# Patient Record
Sex: Male | Born: 1955 | Race: White | Hispanic: No | Marital: Single | Smoking: Former smoker
Health system: Southern US, Community
[De-identification: ages and names within clinical notes are randomized; demographics above are authoritative.]

## PROBLEM LIST (undated history)

## (undated) HISTORY — PX: APPENDECTOMY: SHX54

## (undated) HISTORY — PX: KNEE SURGERY: SHX244

---

## 2014-12-30 DIAGNOSIS — I10 Essential (primary) hypertension: Secondary | ICD-10-CM | POA: Insufficient documentation

## 2014-12-30 DIAGNOSIS — E785 Hyperlipidemia, unspecified: Secondary | ICD-10-CM | POA: Insufficient documentation

## 2018-08-21 ENCOUNTER — Encounter (HOSPITAL_COMMUNITY): Payer: Self-pay | Admitting: Emergency Medicine

## 2018-08-21 ENCOUNTER — Emergency Department (HOSPITAL_COMMUNITY)
Admission: EM | Admit: 2018-08-21 | Discharge: 2018-08-21 | Discharge status: Against Medical Advice | Payer: Medicaid Other | Attending: Emergency Medicine | Admitting: Emergency Medicine

## 2018-08-21 ENCOUNTER — Other Ambulatory Visit: Payer: Self-pay

## 2018-08-21 DIAGNOSIS — F419 Anxiety disorder, unspecified: Secondary | ICD-10-CM | POA: Insufficient documentation

## 2018-08-21 DIAGNOSIS — G4700 Insomnia, unspecified: Secondary | ICD-10-CM | POA: Diagnosis present

## 2018-08-21 DIAGNOSIS — Z87891 Personal history of nicotine dependence: Secondary | ICD-10-CM | POA: Insufficient documentation

## 2018-08-21 LAB — CBC WITH DIFFERENTIAL/PLATELET
Abs Immature Granulocytes: 0.03 10*3/uL (ref 0.00–0.07)
Basophils Absolute: 0.1 10*3/uL (ref 0.0–0.1)
Basophils Relative: 1 %
Eosinophils Absolute: 0.1 10*3/uL (ref 0.0–0.5)
Eosinophils Relative: 1 %
HCT: 45.4 % (ref 39.0–52.0)
Hemoglobin: 15.3 g/dL (ref 13.0–17.0)
Immature Granulocytes: 0 %
Lymphocytes Relative: 40 %
Lymphs Abs: 2.8 10*3/uL (ref 0.7–4.0)
MCH: 31.9 pg (ref 26.0–34.0)
MCHC: 33.7 g/dL (ref 30.0–36.0)
MCV: 94.8 fL (ref 80.0–100.0)
Monocytes Absolute: 0.7 10*3/uL (ref 0.1–1.0)
Monocytes Relative: 9 %
NRBC: 0 % (ref 0.0–0.2)
Neutro Abs: 3.4 10*3/uL (ref 1.7–7.7)
Neutrophils Relative %: 49 %
Platelets: 198 10*3/uL (ref 150–400)
RBC: 4.79 MIL/uL (ref 4.22–5.81)
RDW: 12.1 % (ref 11.5–15.5)
WBC: 7.1 10*3/uL (ref 4.0–10.5)

## 2018-08-21 LAB — URINALYSIS, ROUTINE W REFLEX MICROSCOPIC
Bilirubin Urine: NEGATIVE
Glucose, UA: NEGATIVE mg/dL
HGB URINE DIPSTICK: NEGATIVE
Ketones, ur: NEGATIVE mg/dL
Leukocytes, UA: NEGATIVE
Nitrite: NEGATIVE
Protein, ur: NEGATIVE mg/dL
Specific Gravity, Urine: 1.002 — ABNORMAL LOW (ref 1.005–1.030)
pH: 7 (ref 5.0–8.0)

## 2018-08-21 LAB — COMPREHENSIVE METABOLIC PANEL
ALBUMIN: 4.3 g/dL (ref 3.5–5.0)
ALT: 80 U/L — ABNORMAL HIGH (ref 0–44)
AST: 75 U/L — ABNORMAL HIGH (ref 15–41)
Alkaline Phosphatase: 70 U/L (ref 38–126)
Anion gap: 12 (ref 5–15)
BUN: 11 mg/dL (ref 8–23)
CHLORIDE: 100 mmol/L (ref 98–111)
CO2: 21 mmol/L — ABNORMAL LOW (ref 22–32)
Calcium: 8.6 mg/dL — ABNORMAL LOW (ref 8.9–10.3)
Creatinine, Ser: 0.86 mg/dL (ref 0.61–1.24)
GFR calc Af Amer: >60 mL/min (ref >60)
Glucose, Bld: 123 mg/dL — ABNORMAL HIGH (ref 70–99)
Potassium: 3.5 mmol/L (ref 3.5–5.1)
Sodium: 133 mmol/L — ABNORMAL LOW (ref 135–145)
Total Bilirubin: 0.8 mg/dL (ref 0.3–1.2)
Total Protein: 7.7 g/dL (ref 6.5–8.1)

## 2018-08-21 LAB — RAPID URINE DRUG SCREEN, HOSP PERFORMED
AMPHETAMINES: NOT DETECTED
Barbiturates: NOT DETECTED
Benzodiazepines: NOT DETECTED
Cocaine: NOT DETECTED
Opiates: NOT DETECTED
Tetrahydrocannabinol: NOT DETECTED

## 2018-08-21 LAB — ETHANOL: ALCOHOL ETHYL (B): 199 mg/dL — AB (ref <10)

## 2018-08-21 NOTE — ED Provider Notes (Signed)
[facility] EMERGENCY DEPARTMENT Provider Note   CSN: [id number] Arrival date & time: 08/21/18  1517     History   Chief Complaint Chief Complaint  Patient presents with  . V70.1    HPI Vincent Hart is a 63 y.o. male.  HPI   The patient presents for evaluation of existence, difficulty sleeping, poor appetite, anger, periods of crying, and needing to use alcohol to help calm himself down.  He lives alone.  He came here by private vehicle.  He is somewhat upset over the divorce he went through 5 years ago.  4 weeks ago he stopped smoking to get more healthy.  He feels that this caused him to become irritable, anxious, and have difficulty sleeping.  He is also been bloated recently and describes having occasional hard bowel movements and sometimes soft bowel movements.  He denies fever, chills, chest pain, focal weakness or paresthesia.  Does not have ongoing symptoms of depression although sometimes he cries.  He denies suicidal ideation, currently or suicidal plan.  No prior psychiatric history.  He states he used to use alcohol heavily, but now drinks rarely.  There are no other known modifying factors.  History reviewed. No pertinent past medical history.  There are no active problems to display for this patient.   Past Surgical History:  Procedure Laterality Date  . APPENDECTOMY    . KNEE SURGERY Left         Home Medications    Prior to Admission medications   Not on File    Family History No family history on file.  Social History Social History   Tobacco Use  . Smoking status: Former Smoker    Last attempt to quit: 07/24/2018    Years since quitting: 0.0  . Smokeless tobacco: Never Used  Substance Use Topics  . Alcohol use: Yes    Alcohol/week: 2.0 standard drinks    Types: 2 Glasses of wine per week    Comment: 1-2 glasses of wine per day   . Drug use: Not on file     Allergies   Patient has no known allergies.   Review of Systems Review of  Systems  All other systems reviewed and are negative.    Physical Exam Updated Vital Signs BP (!) 140/109   Pulse (!) 103   Temp 98 F (36.7 C) (Oral)   Resp 16   Ht 5\' 10"  (1.778 m)   Wt 100.2 kg   SpO2 (!) 88% Comment: edp aware, pt aware his o2 sat is reading low, pt still requesting to leave.  BMI 31.71 kg/m   Physical Exam Vitals signs and nursing note reviewed.  Constitutional:      General: He is not in acute distress.    Appearance: Normal appearance. He is well-developed. He is obese. He is not ill-appearing, toxic-appearing or diaphoretic.  HENT:     Head: Normocephalic and atraumatic.     Right Ear: External ear normal.     Left Ear: External ear normal.  Eyes:     Conjunctiva/sclera: Conjunctivae normal.     Pupils: Pupils are equal, round, and reactive to light.  Neck:     Musculoskeletal: Normal range of motion and neck supple.     Trachea: Phonation normal.  Cardiovascular:     Rate and Rhythm: Normal rate and regular rhythm.     Heart sounds: Normal heart sounds.  Pulmonary:     Effort: Pulmonary effort is normal.     Breath  sounds: Normal breath sounds.  Abdominal:     Palpations: Abdomen is soft.     Tenderness: There is no abdominal tenderness.  Musculoskeletal: Normal range of motion.  Skin:    General: Skin is warm and dry.  Neurological:     Mental Status: He is alert and oriented to person, place, and time.     Cranial Nerves: No cranial nerve deficit.     Sensory: No sensory deficit.     Motor: No abnormal muscle tone.     Coordination: Coordination normal.     Comments: No dysarthria, aphasia or nystagmus.  Psychiatric:        Behavior: Behavior normal.        Thought Content: Thought content normal.        Judgment: Judgment normal.     Comments: Alternately anxious and tearful.  He is cooperative.  He does not appear to be responding to internal stimuli.  Patient is lucid.      ED Treatments / Results  Labs (all labs ordered  are listed, but only abnormal results are displayed) Labs Reviewed  ETHANOL - Abnormal; Notable for the following components:      Result Value   Alcohol, Ethyl (B) 199 (*)    All other components within normal limits  URINALYSIS, ROUTINE W REFLEX MICROSCOPIC - Abnormal; Notable for the following components:   Color, Urine COLORLESS (*)    Specific Gravity, Urine 1.002 (*)    All other components within normal limits  CBC WITH DIFFERENTIAL/PLATELET  COMPREHENSIVE METABOLIC PANEL  RAPID URINE DRUG SCREEN, HOSP PERFORMED    EKG None  Radiology No results found.  Procedures Procedures (including critical care time)  Medications Ordered in ED Medications - No data to display   Initial Impression / Assessment and Plan / ED Course  I have reviewed the triage vital signs and the nursing notes.  Pertinent labs & imaging results that were available during my care of the patient were reviewed by me and considered in my medical decision making (see chart for details).      Patient Vitals for the past 24 hrs:  BP Temp Temp src Pulse Resp SpO2 Height Weight  08/21/18 1640 (!) 140/109 - - (!) 103 - (!) 88 % - -  08/21/18 1527 (!) 145/103 98 F (36.7 C) Oral 85 16 97 % - -  08/21/18 1525 - - - - - - 5\' 10"  (1.778 m) 100.2 kg    4:40 PM Reevaluation with update and discussion. After initial assessment and treatment, an updated evaluation reveals he is standing in the hall at the nurses desk, having his vital signs checked.  He is alert and cooperative and states he wants to go, now.  He is respectful and shook my hand.  He was encouraged to return for ongoing problems and to seek care as needed.  He is choosing to leave AGAINST MEDICAL ADVICE at this time. Mancel BaleElliott Milanni Ayub   Medical Decision Making: Nonspecific anxiety, possibly related to tobacco sensation versus suspected alcohol abuse.  Mild depressive symptoms, not severe, and no evidence for suicidal ideation or plan at this time.   There is no indication for medical hospitalization, involuntary commitment, or further treatment in the ED, since the patient is choosing to leave.  The patient appears competent to make the decision to leave.  CRITICAL CARE-no Performed by: Mancel BaleElliott Jmari Pelc  Nursing Notes Reviewed/ Care Coordinated Applicable Imaging Reviewed Interpretation of Laboratory Data incorporated into ED treatment  Disposition-patient left AGAINST MEDICAL ADVICE    Final Clinical Impressions(s) / ED Diagnoses   Final diagnoses:  Anxiety    ED Discharge Orders    None       Mancel Bale, MD 08/23/18 [phone]

## 2018-08-21 NOTE — ED Triage Notes (Addendum)
Bloated, light headed and cant sleep x 4weeks when he quit smoking.  Pt reports that he has also had suicidal thoughts and has been very emotional and just burst into tears at times. Pt became tearful at one time in triage.

## 2018-08-21 NOTE — ED Notes (Signed)
PT requesting to leave before test results are back. EDP made aware and spoke with pt and told him to return to ED if he felt any worse. PT signed out AMA and ambulated down hallway to waiting area.

## 2018-08-21 NOTE — ED Notes (Addendum)
PT c/o increased anxiety and restlessness at night since he quit smoking about 2-3 weeks ago. PT denies SI/HI at this time.

## 2018-08-21 NOTE — ED Notes (Addendum)
While asking the [place] suicide questionnaire the patient states absolutly not to the question about suicidal thoughts.  .  He says he thought everyone had suicidal thoughts.  States he does not have a plan and would never kill himself.

## 2018-08-23 ENCOUNTER — Encounter (HOSPITAL_COMMUNITY): Payer: Self-pay | Admitting: Emergency Medicine

## 2018-08-23 ENCOUNTER — Emergency Department (HOSPITAL_COMMUNITY)
Admission: EM | Admit: 2018-08-23 | Discharge: 2018-08-23 | Disposition: A | Discharge status: Home / Self Care | Payer: Medicaid Other | Attending: Emergency Medicine | Admitting: Emergency Medicine

## 2018-08-23 ENCOUNTER — Other Ambulatory Visit: Payer: Self-pay

## 2018-08-23 DIAGNOSIS — Z5321 Procedure and treatment not carried out due to patient leaving prior to being seen by health care provider: Secondary | ICD-10-CM | POA: Diagnosis not present

## 2018-08-23 DIAGNOSIS — R109 Unspecified abdominal pain: Secondary | ICD-10-CM | POA: Diagnosis not present

## 2018-08-23 NOTE — ED Triage Notes (Signed)
Pt called EMS to his home, states he was here 2 days ago, quit smoking and drinking over 2 weeks ago. Today he had 2 glasses of wine due to feeling anxious and no sleep.  He was able to sleep for a little while, after waking up he feels like he could come out of his skin, aching all over. Denies SI/HI

## 2018-08-23 NOTE — ED Triage Notes (Signed)
Continues to have nausea, dry heaves, and decreased appetite.

## 2018-08-25 ENCOUNTER — Other Ambulatory Visit: Payer: Self-pay

## 2018-08-25 ENCOUNTER — Emergency Department (HOSPITAL_COMMUNITY)
Admission: EM | Admit: 2018-08-25 | Discharge: 2018-08-25 | Disposition: A | Discharge status: Home / Self Care | Payer: Medicaid Other | Attending: Emergency Medicine | Admitting: Emergency Medicine

## 2018-08-25 ENCOUNTER — Emergency Department (HOSPITAL_COMMUNITY): Payer: Medicaid Other

## 2018-08-25 ENCOUNTER — Encounter (HOSPITAL_COMMUNITY): Payer: Self-pay

## 2018-08-25 DIAGNOSIS — F10129 Alcohol abuse with intoxication, unspecified: Secondary | ICD-10-CM | POA: Insufficient documentation

## 2018-08-25 DIAGNOSIS — R0789 Other chest pain: Secondary | ICD-10-CM

## 2018-08-25 DIAGNOSIS — R1013 Epigastric pain: Secondary | ICD-10-CM | POA: Insufficient documentation

## 2018-08-25 DIAGNOSIS — F17203 Nicotine dependence unspecified, with withdrawal: Secondary | ICD-10-CM

## 2018-08-25 DIAGNOSIS — F17213 Nicotine dependence, cigarettes, with withdrawal: Secondary | ICD-10-CM | POA: Insufficient documentation

## 2018-08-25 DIAGNOSIS — F10920 Alcohol use, unspecified with intoxication, uncomplicated: Secondary | ICD-10-CM

## 2018-08-25 DIAGNOSIS — R079 Chest pain, unspecified: Secondary | ICD-10-CM | POA: Diagnosis not present

## 2018-08-25 DIAGNOSIS — E860 Dehydration: Secondary | ICD-10-CM | POA: Diagnosis not present

## 2018-08-25 DIAGNOSIS — Z87891 Personal history of nicotine dependence: Secondary | ICD-10-CM | POA: Insufficient documentation

## 2018-08-25 LAB — URINALYSIS, ROUTINE W REFLEX MICROSCOPIC
Bilirubin Urine: NEGATIVE
Glucose, UA: NEGATIVE mg/dL
Hgb urine dipstick: NEGATIVE
Ketones, ur: 5 mg/dL — AB
Leukocytes, UA: NEGATIVE
Nitrite: NEGATIVE
PROTEIN: 30 mg/dL — AB
Specific Gravity, Urine: 1.023 (ref 1.005–1.030)
pH: 5 (ref 5.0–8.0)

## 2018-08-25 LAB — COMPREHENSIVE METABOLIC PANEL
ALT: 92 U/L — ABNORMAL HIGH (ref 0–44)
AST: 100 U/L — AB (ref 15–41)
Albumin: 4.7 g/dL (ref 3.5–5.0)
Alkaline Phosphatase: 91 U/L (ref 38–126)
Anion gap: 13 (ref 5–15)
BUN: 18 mg/dL (ref 8–23)
CO2: 21 mmol/L — ABNORMAL LOW (ref 22–32)
Calcium: 9.1 mg/dL (ref 8.9–10.3)
Chloride: 98 mmol/L (ref 98–111)
Creatinine, Ser: 0.91 mg/dL (ref 0.61–1.24)
GFR calc Af Amer: >60 mL/min (ref >60)
GFR calc non Af Amer: >60 mL/min (ref >60)
GLUCOSE: 115 mg/dL — AB (ref 70–99)
Potassium: 3.9 mmol/L (ref 3.5–5.1)
Sodium: 132 mmol/L — ABNORMAL LOW (ref 135–145)
Total Bilirubin: 0.9 mg/dL (ref 0.3–1.2)
Total Protein: 8.2 g/dL — ABNORMAL HIGH (ref 6.5–8.1)

## 2018-08-25 LAB — CBC WITH DIFFERENTIAL/PLATELET
Abs Immature Granulocytes: 0.03 10*3/uL (ref 0.00–0.07)
Basophils Absolute: 0.1 10*3/uL (ref 0.0–0.1)
Basophils Relative: 0 %
Eosinophils Absolute: 0.1 10*3/uL (ref 0.0–0.5)
Eosinophils Relative: 1 %
HCT: 49 % (ref 39.0–52.0)
HEMOGLOBIN: 16.4 g/dL (ref 13.0–17.0)
Immature Granulocytes: 0 %
Lymphocytes Relative: 30 %
Lymphs Abs: 3.4 10*3/uL (ref 0.7–4.0)
MCH: 31.3 pg (ref 26.0–34.0)
MCHC: 33.5 g/dL (ref 30.0–36.0)
MCV: 93.5 fL (ref 80.0–100.0)
Monocytes Absolute: 0.7 10*3/uL (ref 0.1–1.0)
Monocytes Relative: 6 %
NEUTROS PCT: 63 %
Neutro Abs: 7 10*3/uL (ref 1.7–7.7)
Platelets: 181 10*3/uL (ref 150–400)
RBC: 5.24 MIL/uL (ref 4.22–5.81)
RDW: 12 % (ref 11.5–15.5)
WBC: 11.2 10*3/uL — ABNORMAL HIGH (ref 4.0–10.5)
nRBC: 0 % (ref 0.0–0.2)

## 2018-08-25 LAB — TROPONIN I: Troponin I: <0.03 ng/mL (ref <0.03)

## 2018-08-25 LAB — ETHANOL: Alcohol, Ethyl (B): 184 mg/dL — ABNORMAL HIGH (ref <10)

## 2018-08-25 LAB — MAGNESIUM: Magnesium: 2.3 mg/dL (ref 1.7–2.4)

## 2018-08-25 LAB — LIPASE, BLOOD: Lipase: 59 U/L — ABNORMAL HIGH (ref 11–51)

## 2018-08-25 MED ORDER — LORAZEPAM 2 MG/ML IJ SOLN: 0.0000 mg | Freq: Two times a day (BID) | INTRAMUSCULAR | Status: DC

## 2018-08-25 MED ORDER — THIAMINE HCL 100 MG/ML IJ SOLN: 100.0000 mg | Freq: Every day | INTRAMUSCULAR | Status: DC

## 2018-08-25 MED ORDER — FAMOTIDINE IN NACL 20-0.9 MG/50ML-% IV SOLN
20.0000 mg | Freq: Once | INTRAVENOUS | Status: AC
  Administered 2018-08-25: 20 mg via INTRAVENOUS
  Filled 2018-08-25: qty 50

## 2018-08-25 MED ORDER — SODIUM CHLORIDE 0.9 % IV BOLUS
1000.0000 mL | Freq: Once | INTRAVENOUS | Status: AC
  Administered 2018-08-25: 1000 mL via INTRAVENOUS

## 2018-08-25 MED ORDER — CHLORDIAZEPOXIDE HCL 25 MG PO CAPS: ORAL_CAPSULE | ORAL | 0 refills | Status: AC

## 2018-08-25 MED ORDER — LORAZEPAM 2 MG/ML IJ SOLN: 0.0000 mg | Freq: Four times a day (QID) | INTRAMUSCULAR | Status: DC

## 2018-08-25 MED ORDER — VITAMIN B-1 100 MG PO TABS: 100.0000 mg | ORAL_TABLET | Freq: Every day | ORAL | Status: DC

## 2018-08-25 MED ORDER — LORAZEPAM 2 MG/ML IJ SOLN
1.0000 mg | Freq: Once | INTRAMUSCULAR | Status: AC
  Administered 2018-08-25: 1 mg via INTRAVENOUS
  Filled 2018-08-25: qty 1

## 2018-08-25 MED ORDER — LORAZEPAM 1 MG PO TABS: 0.0000 mg | ORAL_TABLET | Freq: Four times a day (QID) | ORAL | Status: DC

## 2018-08-25 MED ORDER — LORAZEPAM 1 MG PO TABS: 0.0000 mg | ORAL_TABLET | Freq: Two times a day (BID) | ORAL | Status: DC

## 2018-08-25 MED ORDER — IOPAMIDOL (ISOVUE-300) INJECTION 61%
100.0000 mL | Freq: Once | INTRAVENOUS | Status: AC | PRN
  Administered 2018-08-25: 100 mL via INTRAVENOUS

## 2018-08-25 MED ORDER — ONDANSETRON HCL 4 MG/2ML IJ SOLN
4.0000 mg | Freq: Once | INTRAMUSCULAR | Status: AC
  Administered 2018-08-25: 4 mg via INTRAVENOUS
  Filled 2018-08-25: qty 2

## 2018-08-25 NOTE — ED Triage Notes (Signed)
Pt c/o abd pain x 3 days, states he has been vomiting.

## 2018-08-25 NOTE — ED Provider Notes (Signed)
Patient accepted at change of shift, he does have a problem of chronic tobacco use but went cold [place] recently.  He then started drinking because he was feeling more anxious.  He does have a history of significant alcohol use in the past but does not consider himself an alcoholic.  He has a history of pancreatitis many years ago.  He complains of intermittent epigastric pain which last for just a couple of minutes and then goes away.  At this time he is symptom-free.  His labs showed an elevated alcohol consistent with his history as well as a slight elevation in lipase though this was not clinically significant.  CT scan reveals that he does have what appears to be calcific chronic pancreatic findings but given his lack of any tenderness on exam I suspect this is more stomach ulcer related.  I counseled the patient about medications, alcohol, he will be given Librium, he has a follow-up with the family doctor within 4 days.  He is agreeable to return should symptoms worsen.   Eber Hong, MD 08/25/18 1007

## 2018-08-25 NOTE — ED Provider Notes (Signed)
[facility] EMERGENCY DEPARTMENT Provider Note   CSN: [id number] Arrival date & time: 08/25/18  0159  Time seen 3:25 AM   History   Chief Complaint Chief Complaint  Patient presents with  . Abdominal Pain    HPI Vincent Hart is a 63 y.o. male.  HPI patient states about 3 weeks ago he decided to quit smoking and states he was smoking 4 packs a day and he decided to cut back on his drinking.  He states he was drinking 3 mixed drinks a day now he drinks 1 to 2 glasses of wine a day.  He makes a comment that he does not keep alcohol in his house, however he did have wine last night.  He states for the past 3 days he has been unable to sleep.  He has a burning in his whole anterior chest and he initially put his hand on his right lateral abdomen however later he put it on the epigastric area.  He states he cannot sleep because his mind is racing, he specifically states several times he has never used drugs.  He has had nausea and vomiting to the point he is having dry heaves.  He has loss of appetite and is not eating.  He complains of having shakes.  He denies diarrhea, feeling he has things crawling on them or pins-and-needles sensation, or seeing dots or movement on the walls.  He denies fever, chills, cough, sore throat.  He states he feels depressed but is not suicidal or homicidal.  He does complain of epigastric abdominal pain and states "I am burning all over".  He states his last ED visit his pulse ox was 88% and he was feeling short of breath then but not now.  Patient states he had not seen a doctor in 20 years.    History reviewed. No pertinent past medical history.  There are no active problems to display for this patient.   Past Surgical History:  Procedure Laterality Date  . APPENDECTOMY    . KNEE SURGERY Left         Home Medications    Per patient aspirin and melatonin  Prior to Admission medications   Not on File    Family History No family history on  file.  Social History Social History   Tobacco Use  . Smoking status: Former Smoker    Last attempt to quit: 07/24/2018    Years since quitting: 0.0  . Smokeless tobacco: Never Used  Substance Use Topics  . Alcohol use: Yes    Alcohol/week: 2.0 standard drinks    Types: 2 Glasses of wine per week    Comment: 1-2 glasses of wine per day   . Drug use: Never  Patient used to be in the military   Allergies   Patient has no known allergies.   Review of Systems Review of Systems  All other systems reviewed and are negative.    Physical Exam Updated Vital Signs BP (!) 147/109 (BP Location: Left Arm)   Pulse (!) 102   Temp 98.9 F (37.2 C) (Oral)   Resp 19   Ht 5\' 10"  (1.778 m)   Wt 97.5 kg   SpO2 94%   BMI 30.85 kg/m   Vital signs normal except diastolic hypertension, tachycardia   Physical Exam Vitals signs and nursing note reviewed.  Constitutional:      General: He is in acute distress.     Appearance: Normal appearance. He is well-developed. He is  not ill-appearing or toxic-appearing.     Comments: Patient has trouble sitting still, he is pacing around the room.  HENT:     Head: Normocephalic and atraumatic.     Right Ear: External ear normal.     Left Ear: External ear normal.     Nose: Nose normal. No mucosal edema or rhinorrhea.     Mouth/Throat:     Mouth: Mucous membranes are dry.     Dentition: No dental abscesses.     Pharynx: No uvula swelling.     Comments: No tremor of the tongue Eyes:     Conjunctiva/sclera: Conjunctivae normal.     Pupils: Pupils are equal, round, and reactive to light.  Neck:     Musculoskeletal: Full passive range of motion without pain, normal range of motion and neck supple.  Cardiovascular:     Rate and Rhythm: Normal rate and regular rhythm.     Heart sounds: Normal heart sounds. No murmur. No friction rub. No gallop.   Pulmonary:     Effort: Pulmonary effort is normal. No respiratory distress.     Breath sounds:  Normal breath sounds. No wheezing, rhonchi or rales.  Chest:     Chest wall: No tenderness or crepitus.  Abdominal:     General: Bowel sounds are normal. There is no distension.     Palpations: Abdomen is soft.     Tenderness: There is abdominal tenderness in the epigastric area. There is no guarding or rebound.  Musculoskeletal: Normal range of motion.        General: No tenderness.     Comments: Moves all extremities well.   Skin:    General: Skin is warm and dry.     Coloration: Skin is not pale.     Findings: No erythema or rash.  Neurological:     General: No focal deficit present.     Mental Status: He is alert and oriented to person, place, and time.     Cranial Nerves: No cranial nerve deficit.     Comments: No tremor or asterixis  Psychiatric:        Mood and Affect: Mood is anxious.        Speech: Speech is rapid and pressured.        Behavior: Behavior is agitated.      ED Treatments / Results  Labs (all labs ordered are listed, but only abnormal results are displayed) Results for orders placed or performed during the hospital encounter of 08/25/18  Comprehensive metabolic panel  Result Value Ref Range   Sodium 132 (L) 135 - 145 mmol/L   Potassium 3.9 3.5 - 5.1 mmol/L   Chloride 98 98 - 111 mmol/L   CO2 21 (L) 22 - 32 mmol/L   Glucose, Bld 115 (H) 70 - 99 mg/dL   BUN 18 8 - 23 mg/dL   Creatinine, Ser 1.610.91 0.61 - 1.24 mg/dL   Calcium 9.1 8.9 - 09.610.3 mg/dL   Total Protein 8.2 (H) 6.5 - 8.1 g/dL   Albumin 4.7 3.5 - 5.0 g/dL   AST 045100 (H) 15 - 41 U/L   ALT 92 (H) 0 - 44 U/L   Alkaline Phosphatase 91 38 - 126 U/L   Total Bilirubin 0.9 0.3 - 1.2 mg/dL   GFR calc non Af Amer >60 >60 mL/min   GFR calc Af Amer >60 >60 mL/min   Anion gap 13 5 - 15  Lipase, blood  Result Value Ref Range   Lipase  59 (H) 11 - 51 U/L  CBC with Differential  Result Value Ref Range   WBC 11.2 (H) 4.0 - 10.5 K/uL   RBC 5.24 4.22 - 5.81 MIL/uL   Hemoglobin 16.4 13.0 - 17.0 g/dL   HCT  16.1 09.6 - 04.5 %   MCV 93.5 80.0 - 100.0 fL   MCH 31.3 26.0 - 34.0 pg   MCHC 33.5 30.0 - 36.0 g/dL   RDW 40.9 81.1 - 91.4 %   Platelets 181 150 - 400 K/uL   nRBC 0.0 0.0 - 0.2 %   Neutrophils Relative % 63 %   Neutro Abs 7.0 1.7 - 7.7 K/uL   Lymphocytes Relative 30 %   Lymphs Abs 3.4 0.7 - 4.0 K/uL   Monocytes Relative 6 %   Monocytes Absolute 0.7 0.1 - 1.0 K/uL   Eosinophils Relative 1 %   Eosinophils Absolute 0.1 0.0 - 0.5 K/uL   Basophils Relative 0 %   Basophils Absolute 0.1 0.0 - 0.1 K/uL   Immature Granulocytes 0 %   Abs Immature Granulocytes 0.03 0.00 - 0.07 K/uL  Urinalysis, Routine w reflex microscopic  Result Value Ref Range   Color, Urine YELLOW YELLOW   APPearance HAZY (A) CLEAR   Specific Gravity, Urine 1.023 1.005 - 1.030   pH 5.0 5.0 - 8.0   Glucose, UA NEGATIVE NEGATIVE mg/dL   Hgb urine dipstick NEGATIVE NEGATIVE   Bilirubin Urine NEGATIVE NEGATIVE   Ketones, ur 5 (A) NEGATIVE mg/dL   Protein, ur 30 (A) NEGATIVE mg/dL   Nitrite NEGATIVE NEGATIVE   Leukocytes, UA NEGATIVE NEGATIVE   RBC / HPF 0-5 0 - 5 RBC/hpf   WBC, UA 0-5 0 - 5 WBC/hpf   Bacteria, UA FEW (A) NONE SEEN   Squamous Epithelial / LPF 0-5 0 - 5   Mucus PRESENT    Sperm, UA PRESENT   Magnesium  Result Value Ref Range   Magnesium 2.3 1.7 - 2.4 mg/dL  Ethanol  Result Value Ref Range   Alcohol, Ethyl (B) 184 (H) <10 mg/dL   Laboratory interpretation all normal except leukocytosis, intoxication with alcohol, minor elevation of LFTs consistent with alcohol abuse    EKG None  Radiology No results found.  Procedures Procedures (including critical care time)  Medications Ordered in ED Medications  LORazepam (ATIVAN) injection 0-4 mg (0 mg Intravenous Not Given 08/25/18 0703)    Or  LORazepam (ATIVAN) tablet 0-4 mg ( Oral See Alternative 08/25/18 0703)  LORazepam (ATIVAN) injection 0-4 mg (has no administration in time range)    Or  LORazepam (ATIVAN) tablet 0-4 mg (has no  administration in time range)  thiamine (VITAMIN B-1) tablet 100 mg (has no administration in time range)    Or  thiamine (B-1) injection 100 mg (has no administration in time range)  sodium chloride 0.9 % bolus 1,000 mL (1,000 mLs Intravenous New Bag/Given 08/25/18 0641)  famotidine (PEPCID) IVPB 20 mg premix (has no administration in time range)  sodium chloride 0.9 % bolus 1,000 mL (0 mLs Intravenous Stopped 08/25/18 0641)  sodium chloride 0.9 % bolus 1,000 mL (0 mLs Intravenous Stopped 08/25/18 0641)  ondansetron (ZOFRAN) injection 4 mg (4 mg Intravenous Given 08/25/18 0403)  LORazepam (ATIVAN) injection 1 mg (1 mg Intravenous Given 08/25/18 0403)     Initial Impression / Assessment and Plan / ED Course  I have reviewed the triage vital signs and the nursing notes.  Pertinent labs & imaging results that were available during my care  of the patient were reviewed by me and considered in my medical decision making (see chart for details).     Patient appeared dehydrated, he was given IV fluids, IV Zofran, and IV Ativan.  His first CIWA score was 7  Recheck at 6:30 AM patient has put out about 200 cc of urine.  He has finished his 2 L bolus.  He states he is feeling shaky.  He was given a 3rd liter  Recheck at 7:30, patient has had his number 3 L of IV fluids.  He states "I am shaking".  He holds his hand up and violently shakes it.  However when I touch his other hand there is no tremor and then it stops shaking.  He is now complaining again of this chest pain and abdominal pain.  He states the chest pain has been there for at least 3 weeks.  He was given IV Pepcid.  EKG and troponin was added to his testing and CT abdomen was added.  He denies any prior history of DTs or withdrawal seizures.  Pt left at change of shift with Dr Hyacinth Meeker to get results of his CT scan and disposition.  Final Clinical Impressions(s) / ED Diagnoses   Final diagnoses:  Nicotine withdrawal  Atypical chest pain   Alcoholic intoxication without complication (HCC)  Epigastric abdominal pain    Disposition pending   Devoria Albe, MD, Concha Pyo, MD, Concha Pyo, MD 08/25/18 [phone]

## 2018-08-25 NOTE — ED Notes (Signed)
Pt states he still is not feeling well and has continued abdominal pain, MD & RN made aware.

## 2018-08-25 NOTE — Discharge Instructions (Signed)
These take the Librium exactly as prescribed, do not drink anymore alcohol, please follow-up with your doctor at your appointment on 5 February.  You should also start taking Pepcid twice a day, avoid any alcohol or anti-inflammatory medication such as ibuprofen or Aleve or aspirin as they will make the pain worse  Drink plenty of clear liquids for the next 48 hours  Emergency department for severe or worsening symptoms

## 2018-08-29 DIAGNOSIS — Z8719 Personal history of other diseases of the digestive system: Secondary | ICD-10-CM | POA: Insufficient documentation

## 2018-08-29 DIAGNOSIS — F411 Generalized anxiety disorder: Secondary | ICD-10-CM | POA: Insufficient documentation

## 2018-08-29 DIAGNOSIS — K760 Fatty (change of) liver, not elsewhere classified: Secondary | ICD-10-CM | POA: Insufficient documentation

## 2019-11-04 IMAGING — CT CT ABD-PELV W/ CM
2 of 5 series · 15 of 46 positions shown, 17 images · IV contrast (Isovue)
Comparison: None.

CLINICAL DATA: Abdominal pain for the past 3 days.  Vomiting.

EXAM:
CT ABDOMEN AND PELVIS WITH CONTRAST
TECHNIQUE: Multidetector CT imaging of the abdomen and pelvis was performed
using the standard protocol following bolus administration of
intravenous contrast.
CONTRAST:  100mL [id number] IOPAMIDOL ([id number]) INJECTION 61%

[Series 2: axial st · axial · 0.81mm/px · z∈[-516,-66]mm · 12 of 103 slices shown, 14 images]
[im 7/103  soft-tissue]
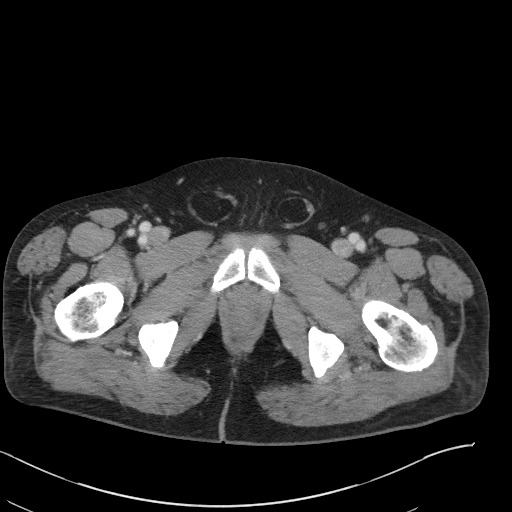
[im 7/103  bone]
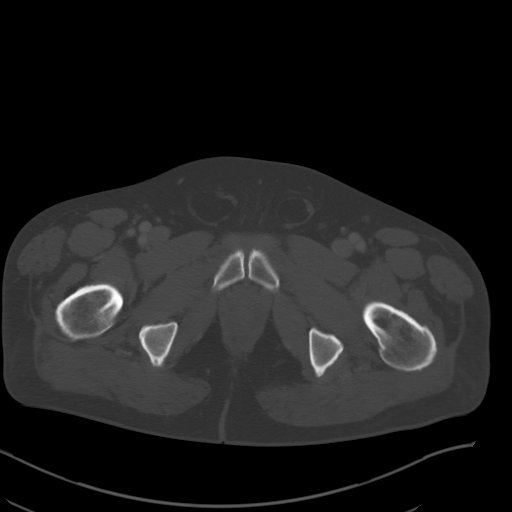
[im 19/103  soft-tissue]
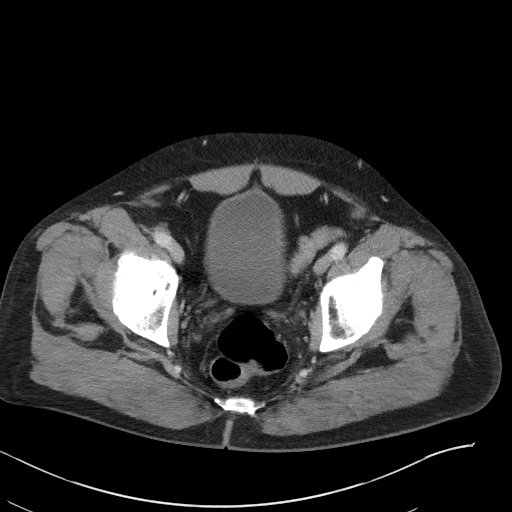
[im 25/103  soft-tissue]
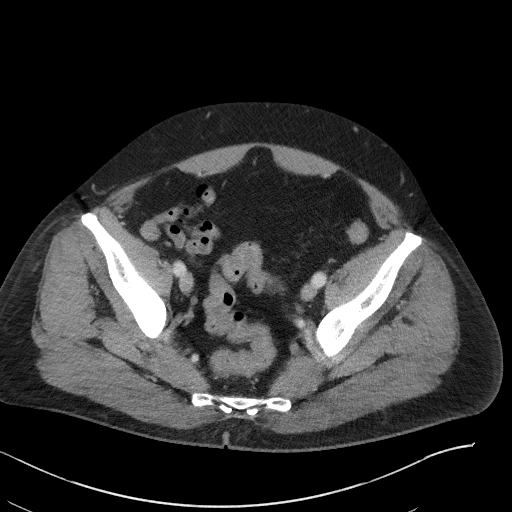
[im 31/103  soft-tissue]
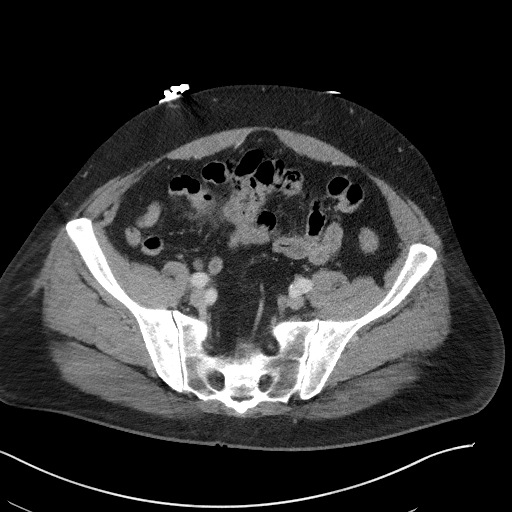
[im 43/103  soft-tissue]
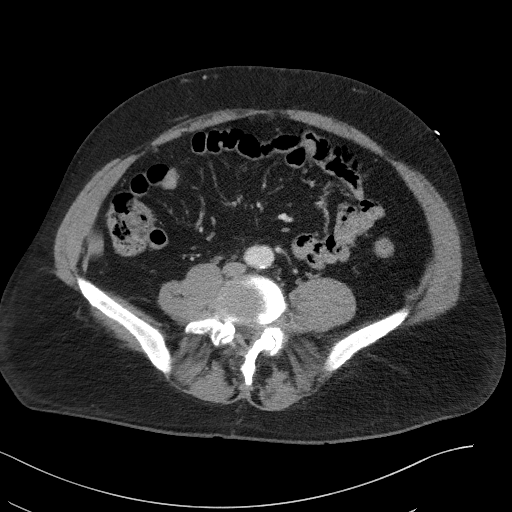
[im 49/103  soft-tissue]
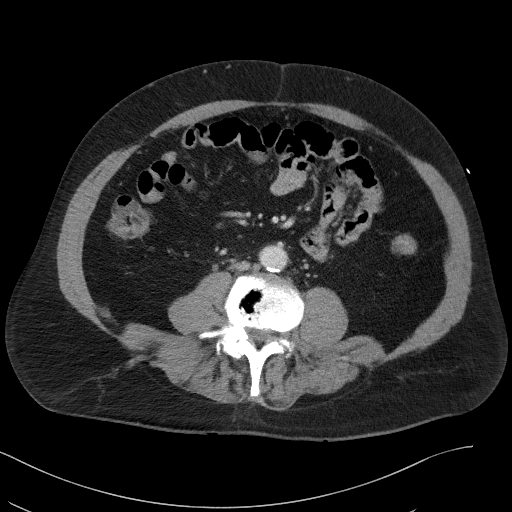
[im 55/103  soft-tissue]
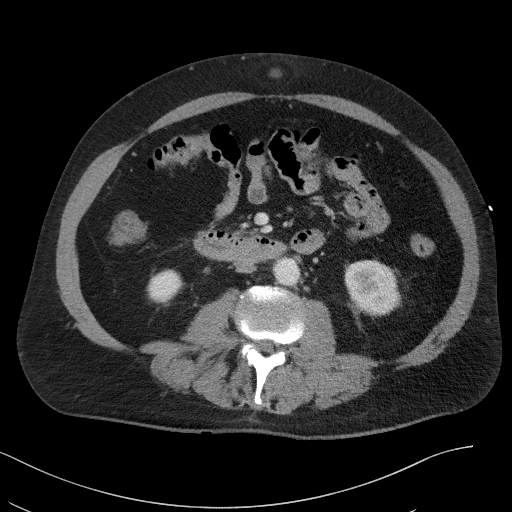
[im 67/103  soft-tissue]
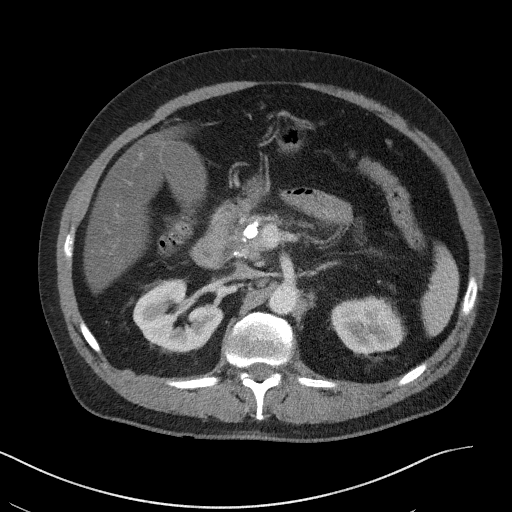
[im 73/103  soft-tissue]
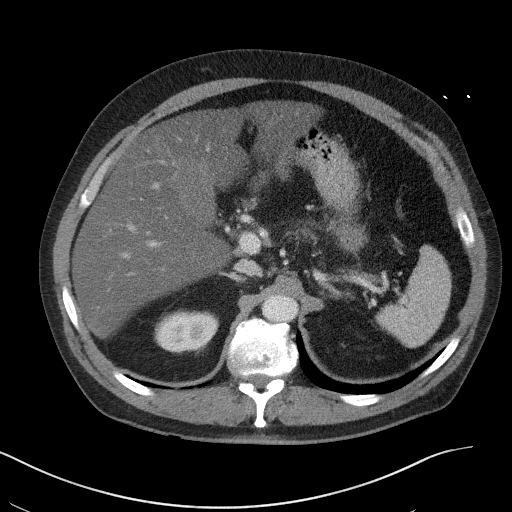
[im 73/103  bone]
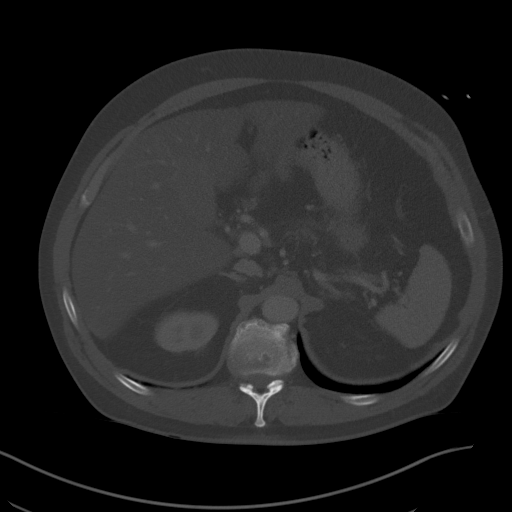
[im 79/103  soft-tissue]
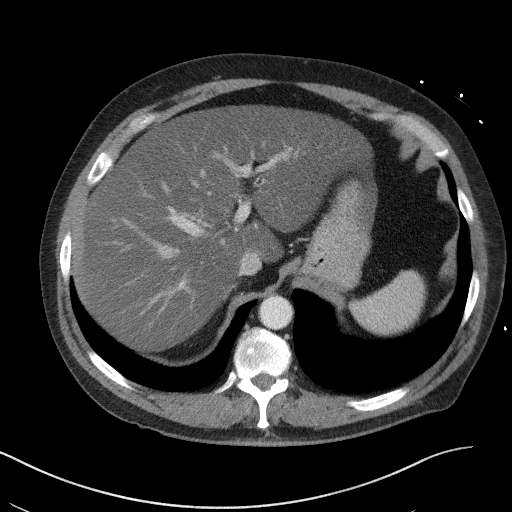
[im 91/103  soft-tissue]
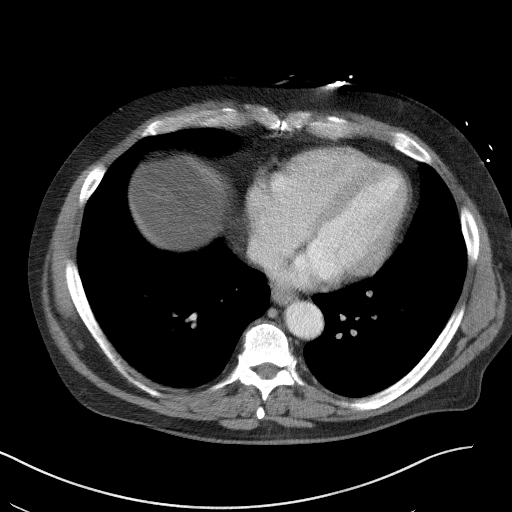
[im 97/103  soft-tissue]
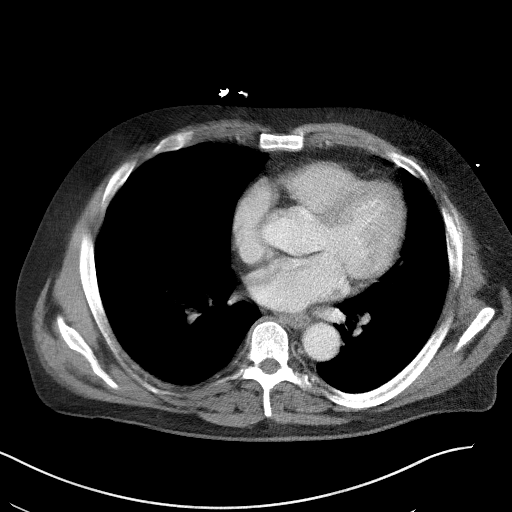

[Series 6: coronal st · coronal · 0.89mm/px · 3 of 117 slices shown]
[im 39/117  soft-tissue]
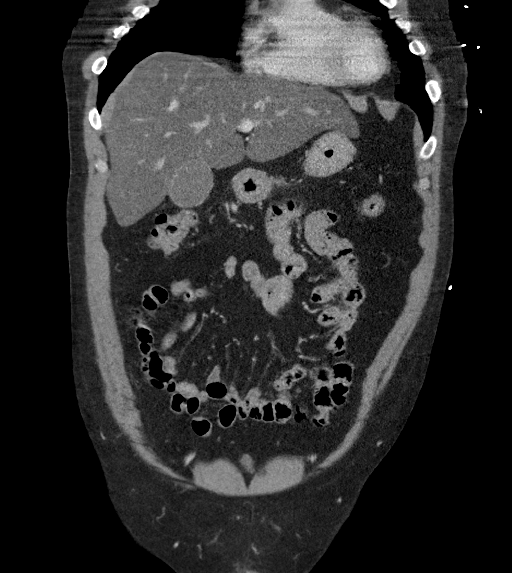
[im 52/117  soft-tissue]
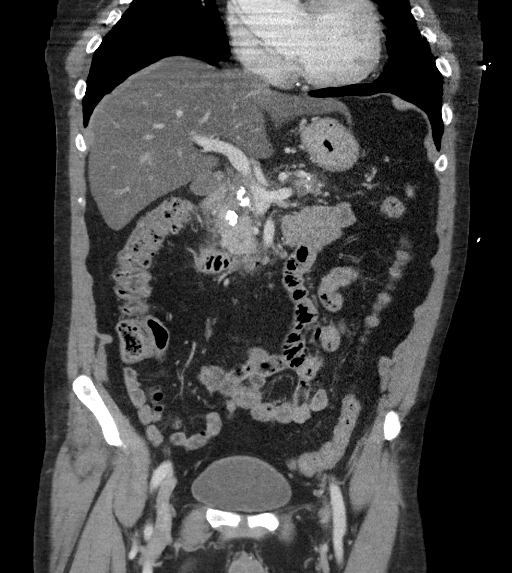
[im 65/117  soft-tissue]
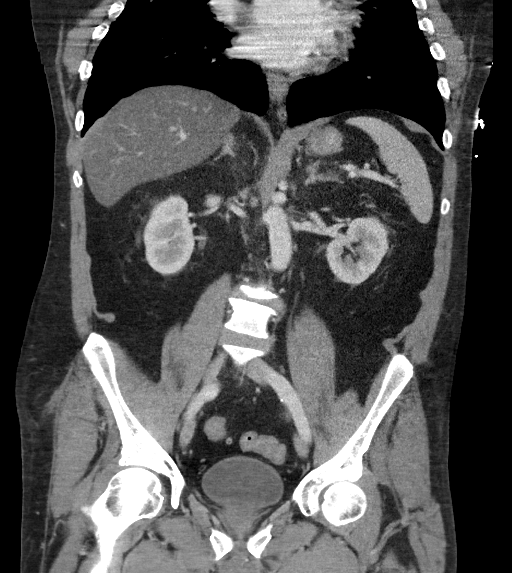

[15 of 46 positions shown; findings below may reference images not displayed]

FINDINGS: Lower chest: Limited visualization of the lower thorax demonstrates
minimal dependent subpleural ground-glass atelectasis. No discrete
focal airspace opacities. No pleural effusion.

Borderline cardiomegaly.  No pericardial effusion.

Hepatobiliary: There is diffuse decreased attenuation of the hepatic
parenchyma on this postcontrast examination suggestive of hepatic
steatosis. No discrete hepatic lesions. Normal appearance of the
gallbladder given degree distention. No radiopaque gallstones. No
intra or extrahepatic biliary dilatation. No ascites.

Pancreas: The pancreas is atrophic with multiple calcifications,
likely the sequela of chronic calcific pancreatitis. A minimal
amount of peripancreatic stranding is not excluded on the basis this
examination.

Spleen: Normal appearance of the spleen.

Adrenals/Urinary Tract: There is symmetric enhancement and excretion
of the bilateral kidneys. No definite renal stones on this
postcontrast examination. There is a minimal amount of symmetric
bilateral perinephric stranding, likely age and body habitus
related. No urinary obstruction.

Normal appearance the bilateral adrenal glands.

Normal appearance of the urinary bladder given degree distention.

Stomach/Bowel: Scattered colonic diverticulosis without evidence of
acute diverticulitis. Normal appearance of the terminal ileum. The
appendix is not visualized compatible with provided operative
history. No pneumoperitoneum, pneumatosis or portal venous gas.

Vascular/Lymphatic: Atherosclerotic plaque within a mildly tortuous
but normal caliber abdominal aorta. The major branch vessels of the
abdominal aorta appear patent on this non CTA examination.

No bulky retroperitoneal, mesenteric, pelvic or inguinal
lymphadenopathy.

Reproductive: Dystrophic calcifications within normal sized prostate
gland. Punctate phleboliths are seen with the lower pelvis
bilaterally. No free fluid in the pelvic cul-de-sac.

Other: Small bilateral mesenteric fat containing inguinal hernias.
Note is also made of a approximately 3.9 x 1.9 x 2.8 cm mesenteric
fat containing hernia along the mid ventral abdominal wall with
narrow neck measuring approximately 0.5 cm (image 76, series 7).

Musculoskeletal: No acute or aggressive osseous abnormalities.

Note is made of a right-sided L3 pars defect without associated
anterolisthesis. Moderate to severe DDD of L3-L4 with disc space
height loss, endplate irregularity and sclerosis. Mild scoliotic
curvature of the thoracolumbar spine with dominant component convex
to the left.
IMPRESSION: 1. Pancreatic atrophy with extensive pancreatic calcifications and
potential minimal amount of peripancreatic stranding as could be
seen in the setting of acute on chronic calcific pancreatitis.
Correlation with amylase and lipase levels could be performed as
indicated.
2. Diffuse decreased attenuation of the hepatic parenchyma as could
be seen in advanced hepatic steatosis. Correlation with LFTs could
be performed as indicated.
3. Approximately 3.9 cm narrow neck mesenteric fat containing
midline ventral abdominal wall and bilateral mesenteric fat
containing inguinal hernias.
4. Colonic diverticulosis without evidence of superimposed acute
diverticulitis.
5. Aortic Atherosclerosis ([id number]).

## 2021-06-04 DIAGNOSIS — M419 Scoliosis, unspecified: Secondary | ICD-10-CM | POA: Insufficient documentation

## 2021-06-04 DIAGNOSIS — M159 Polyosteoarthritis, unspecified: Secondary | ICD-10-CM | POA: Insufficient documentation

## 2021-06-04 DIAGNOSIS — M5135 Other intervertebral disc degeneration, thoracolumbar region: Secondary | ICD-10-CM | POA: Insufficient documentation

## 2021-11-04 DIAGNOSIS — R7303 Prediabetes: Secondary | ICD-10-CM | POA: Insufficient documentation

## 2021-11-05 DIAGNOSIS — K8689 Other specified diseases of pancreas: Secondary | ICD-10-CM | POA: Insufficient documentation

## 2021-12-30 ENCOUNTER — Encounter: Payer: Self-pay | Admitting: Physician Assistant

## 2021-12-30 ENCOUNTER — Ambulatory Visit: Payer: Medicare Other | Admitting: Physician Assistant

## 2021-12-30 DIAGNOSIS — J069 Acute upper respiratory infection, unspecified: Secondary | ICD-10-CM | POA: Diagnosis not present

## 2021-12-30 DIAGNOSIS — F411 Generalized anxiety disorder: Secondary | ICD-10-CM

## 2021-12-30 MED ORDER — CETIRIZINE HCL 10 MG PO TABS: 10.0000 mg | ORAL_TABLET | Freq: Every day | ORAL | 11 refills | Status: AC

## 2021-12-30 MED ORDER — AZITHROMYCIN 250 MG PO TABS: ORAL_TABLET | ORAL | 0 refills | Status: AC

## 2021-12-30 MED ORDER — BENZONATATE 200 MG PO CAPS: 200.0000 mg | ORAL_CAPSULE | Freq: Two times a day (BID) | ORAL | 0 refills | Status: AC | PRN

## 2021-12-30 MED ORDER — HYDROXYZINE HCL 25 MG PO TABS: 25.0000 mg | ORAL_TABLET | Freq: Three times a day (TID) | ORAL | 0 refills | Status: AC

## 2021-12-30 NOTE — Patient Instructions (Signed)
You are going to take azithromycin as directed.  You will also start taking Zyrtec on a daily basis, and I sent Tessalon Perles to help with your cough.  I did send a refill of your hydroxyzine to help you with your anxiety.  Make sure that you are staying very well-hydrated and get plenty of rest.  Please let us know if there is anything else we can do for you.  Kennieth Rad, PA-C Physician Assistant [facility] [email or web address]   Upper Respiratory Infection, Adult An upper respiratory infection (URI) is a common viral infection of the nose, throat, and upper air passages that lead to the lungs. The most common type of URI is the common cold. URIs usually get better on their own, without medical treatment. What are the causes? A URI is caused by a virus. You may catch a virus by: Breathing in droplets from an infected person's cough or sneeze. Touching something that has been exposed to the virus (is contaminated) and then touching your mouth, nose, or eyes. What increases the risk? You are more likely to get a URI if: You are very young or very old. You have close contact with others, such as at work, school, or a health care facility. You smoke. You have long-term (chronic) heart or lung disease. You have a weakened disease-fighting system (immune system). You have nasal allergies or asthma. You are experiencing a lot of stress. You have poor nutrition. What are the signs or symptoms? A URI usually involves some of the following symptoms: Runny or stuffy (congested) nose. Cough. Sneezing. Sore throat. Headache. Fatigue. Fever. Loss of appetite. Pain in your forehead, behind your eyes, and over your cheekbones (sinus pain). Muscle aches. Redness or irritation of the eyes. Pressure in the ears or face. How is this diagnosed? This condition may be diagnosed based on your medical history and symptoms, and a  physical exam. Your health care provider may use a swab to take a mucus sample from your nose (nasal swab). This sample can be tested to determine what virus is causing the illness. How is this treated? URIs usually get better on their own within 7-10 days. Medicines cannot cure URIs, but your health care provider may recommend certain medicines to help relieve symptoms, such as: Over-the-counter cold medicines. Cough suppressants. Coughing is a type of defense against infection that helps to clear the respiratory system, so take these medicines only as recommended by your health care provider. Fever-reducing medicines. Follow these instructions at home: Activity Rest as needed. If you have a fever, stay home from work or school until your fever is gone or until your health care provider says your URI cannot spread to other people (is no longer contagious). Your health care provider may have you wear a face mask to prevent your infection from spreading. Relieving symptoms Gargle with a mixture of salt and water 3-4 times a day or as needed. To make salt water, completely dissolve -1 tsp (3-6 g) of salt in 1 cup (237 mL) of warm water. Use a cool-mist humidifier to add moisture to the air. This can help you breathe more easily. Eating and drinking  Drink enough fluid to keep your urine pale yellow. Eat soups and other clear broths. General instructions  Take over-the-counter and prescription medicines only as told by your health care provider. These include cold medicines, fever reducers, and cough suppressants. Do not use any products that contain nicotine or tobacco. These products include cigarettes, chewing  tobacco, and vaping devices, such as e-cigarettes. If you need help quitting, ask your health care provider. Stay away from secondhand smoke. Stay up to date on all immunizations, including the yearly (annual) flu vaccine. Keep all follow-up visits. This is important. How to prevent  the spread of infection to others URIs can be contagious. To prevent the infection from spreading: Wash your hands with soap and water for at least 20 seconds. If soap and water are not available, use hand sanitizer. Avoid touching your mouth, face, eyes, or nose. Cough or sneeze into a tissue or your sleeve or elbow instead of into your hand or into the air.  Contact a health care provider if: You are getting worse instead of better. You have a fever or chills. Your mucus is brown or red. You have yellow or brown discharge coming from your nose. You have pain in your face, especially when you bend forward. You have swollen neck glands. You have pain while swallowing. You have white areas in the back of your throat. Get help right away if: You have shortness of breath that gets worse. You have severe or persistent: Headache. Ear pain. Sinus pain. Chest pain. You have chronic lung disease along with any of the following: Making high-pitched whistling sounds when you breathe, most often when you breathe out (wheezing). Prolonged cough (more than 14 days). Coughing up blood. A change in your usual mucus. You have a stiff neck. You have changes in your: Vision. Hearing. Thinking. Mood. These symptoms may be an emergency. Get help right away. Call 911. Do not wait to see if the symptoms will go away. Do not drive yourself to the hospital. Summary An upper respiratory infection (URI) is a common infection of the nose, throat, and upper air passages that lead to the lungs. A URI is caused by a virus. URIs usually get better on their own within 7-10 days. Medicines cannot cure URIs, but your health care provider may recommend certain medicines to help relieve symptoms. This information is not intended to replace advice given to you by your health care provider. Make sure you discuss any questions you have with your health care provider. Document Revised: 02/10/2021 Document  Reviewed: 02/10/2021 Elsevier Patient Education  [place].

## 2021-12-30 NOTE — Progress Notes (Signed)
New Patient Office Visit  Subjective    Patient ID: Vincent Hart, male    DOB: [birth year]  Age: 66 y.o. MRN: [id number]  CC:  Chief Complaint  Patient presents with   URI    Congestion, sore throat, cramps, maybe fever last night.   Virtual Visit via Telephone Note  I connected with Marlin Canary on 12/30/21 at 12:00 PM EDT by telephone and verified that I am speaking with the correct person using two identifiers.  Location: Patient: parking lot  Provider: [facility]    I discussed the limitations, risks, security and privacy concerns of performing an evaluation and management service by telephone and the availability of in person appointments. I also discussed with the patient that there may be a patient responsible charge related to this service. The patient expressed understanding and agreed to proceed.   History of Present Illness: States that he started feeling poorly two days ago, states that he has been having a body ache, runny nose, body aches, sore throat, right ear, cough with greenish white sputum.  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  States that he has not tried anything for relief.  States that he is eating and drinking okay. Volunteers in shelter in kitchen Has not tried anything for relief  States that he is feeling very edgy, has previously been prescribed hydroxyzine with relief, but has been ut of this medication.  Requests refill.  No abx in the last 30 days  Goes to [facility] in [employer]  Observations/Objective: Medical history and current medications reviewed, no physical exam completed  Outpatient Encounter Medications as of 12/30/2021  Medication Sig   azithromycin (ZITHROMAX) 250 MG tablet Take 2 tabs PO day 1, then take 1 tab PO once daily   benzonatate (TESSALON) 200 MG capsule Take 1 capsule (200 mg total) by mouth 2  (two) times daily as needed for cough.   cetirizine (ZYRTEC ALLERGY) 10 MG tablet Take 1 tablet (10 mg total) by mouth daily.   escitalopram (LEXAPRO) 20 MG tablet Take 20 mg by mouth daily.   metFORMIN (GLUCOPHAGE) 500 MG tablet Take 500 mg by mouth 2 (two) times daily.   predniSONE (DELTASONE) 10 MG tablet Take by mouth.   traZODone (DESYREL) 150 MG tablet Take 150 mg by mouth at bedtime.   chlordiazePOXIDE (LIBRIUM) 25 MG capsule 50mg  PO TID x 1D, then 25-50mg  PO BID X 1D, then 25-50mg  PO QD X 1D (Patient not taking: Reported on 12/30/2021)   CREON 36000-114000 units CPEP capsule Take by mouth. (Patient not taking: Reported on 12/30/2021)   hydrOXYzine (ATARAX) 25 MG tablet Take 1 tablet (25 mg total) by mouth 3 (three) times daily.   meloxicam (MOBIC) 7.5 MG tablet Take 15 mg by mouth daily. (Patient not taking: Reported on 12/30/2021)   methocarbamol (ROBAXIN) 500 MG tablet Take 500 mg by mouth 2 (two) times daily. (Patient not taking: Reported on 12/30/2021)   naproxen (NAPROSYN) 500 MG tablet Take 500 mg by mouth 2 (two) times daily. (Patient not taking: Reported on 12/30/2021)   pantoprazole (PROTONIX) 40 MG tablet Take 40 mg by mouth daily. (Patient not taking: Reported on 12/30/2021)   [DISCONTINUED] hydrOXYzine (ATARAX) 25 MG tablet Take 25 mg by mouth 3 (three) times daily. (Patient not taking: Reported on 12/30/2021)   No facility-administered encounter medications on file as of 12/30/2021.  History reviewed. No pertinent past medical history.  Past Surgical History:  Procedure Laterality Date   APPENDECTOMY     KNEE SURGERY Left     History reviewed. No pertinent family history.  Social History   Socioeconomic History   Marital status: Single    Spouse name: Not on file   Number of children: Not on file   Years of education: Not on file   Highest education level: Not on file  Occupational History   Not on file  Tobacco Use   Smoking status: Former    Types: Cigarettes    Quit  date: 07/24/2018    Years since quitting: 3.4   Smokeless tobacco: Never  Substance and Sexual Activity   Alcohol use: Yes    Alcohol/week: 2.0 standard drinks of alcohol    Types: 2 Glasses of wine per week    Comment: 1-2 glasses of wine per day    Drug use: Never   Sexual activity: Not on file  Other Topics Concern   Not on file  Social History Narrative   Not on file   Social Determinants of Health   Financial Resource Strain: Not on file  Food Insecurity: Not on file  Transportation Needs: Not on file  Physical Activity: Not on file  Stress: Not on file  Social Connections: Not on file  Intimate Partner Violence: Not on file    Review of Systems  Constitutional:  Negative for chills and fever.  HENT:  Positive for congestion, ear pain and sore throat.   Eyes: Negative.   Respiratory:  Positive for cough and sputum production. Negative for shortness of breath and wheezing.   Cardiovascular:  Negative for chest pain.  Gastrointestinal:  Negative for nausea and vomiting.  Genitourinary: Negative.   Musculoskeletal:  Positive for myalgias.  Skin: Negative.   Neurological: Negative.   Endo/Heme/Allergies: Negative.   Psychiatric/Behavioral:  Negative for suicidal ideas. The patient is nervous/anxious.          Assessment & Plan:   Problem List Items Addressed This Visit   None Visit Diagnoses     Acute upper respiratory infection    -  Primary   Relevant Medications   azithromycin (ZITHROMAX) 250 MG tablet   benzonatate (TESSALON) 200 MG capsule   cetirizine (ZYRTEC ALLERGY) 10 MG tablet   Anxiety state       Relevant Medications   escitalopram (LEXAPRO) 20 MG tablet   traZODone (DESYREL) 150 MG tablet   hydrOXYzine (ATARAX) 25 MG tablet       Assessment and Plan: 1. Acute upper respiratory infection Trial azithromycin, tessalon perles, and zyrtec.  Patient education given on supportive care.  Red flags given for prompt reevaluation. - azithromycin  (ZITHROMAX) 250 MG tablet; Take 2 tabs PO day 1, then take 1 tab PO once daily  Dispense: 6 tablet; Refill: 0 - benzonatate (TESSALON) 200 MG capsule; Take 1 capsule (200 mg total) by mouth 2 (two) times daily as needed for cough.  Dispense: 20 capsule; Refill: 0 - cetirizine (ZYRTEC ALLERGY) 10 MG tablet; Take 1 tablet (10 mg total) by mouth daily.  Dispense: 30 tablet; Refill: 11  2. Anxiety state Continue current regimen. - hydrOXYzine (ATARAX) 25 MG tablet; Take 1 tablet (25 mg total) by mouth 3 (three) times daily.  Dispense: 30 tablet; Refill: 0   Follow Up Instructions:    I discussed the assessment and treatment plan with the patient. The patient was provided an opportunity to ask questions  and all were answered. The patient agreed with the plan and demonstrated an understanding of the instructions.   The patient was advised to call back or seek an in-person evaluation if the symptoms worsen or if the condition fails to improve as anticipated.  I provided 12 minutes of non-face-to-face time during this encounter.  Return if symptoms worsen or fail to improve.   Kasandra Knudsen Mayers, PA-C
